# Patient Record
Sex: Male | Born: 1960 | Race: White | Hispanic: No | Marital: Married | Smoking: Never smoker
Health system: Southern US, Community
[De-identification: ages and names within clinical notes are randomized; demographics above are authoritative.]

## PROBLEM LIST (undated history)

## (undated) DIAGNOSIS — K625 Hemorrhage of anus and rectum: Secondary | ICD-10-CM

## (undated) HISTORY — PX: SIGMOIDOSCOPY: SHX6686

---

## 1999-09-20 ENCOUNTER — Encounter: Payer: Self-pay | Admitting: Emergency Medicine

## 1999-09-20 ENCOUNTER — Emergency Department (HOSPITAL_COMMUNITY): Admission: EM | Admit: 1999-09-20 | Discharge: 1999-09-20 | Payer: Self-pay | Admitting: Emergency Medicine

## 2001-01-13 ENCOUNTER — Encounter: Payer: Self-pay | Admitting: Family Medicine

## 2001-01-13 ENCOUNTER — Ambulatory Visit (HOSPITAL_COMMUNITY): Admission: EM | Admit: 2001-01-13 | Discharge: 2001-01-13 | Payer: Self-pay | Admitting: Family Medicine

## 2003-12-20 ENCOUNTER — Ambulatory Visit (HOSPITAL_COMMUNITY): Admission: RE | Admit: 2003-12-20 | Discharge: 2003-12-20 | Payer: Self-pay | Admitting: Family Medicine

## 2004-03-27 ENCOUNTER — Ambulatory Visit (HOSPITAL_COMMUNITY): Admission: RE | Admit: 2004-03-27 | Discharge: 2004-03-27 | Payer: Self-pay | Admitting: Family Medicine

## 2004-12-01 ENCOUNTER — Emergency Department (HOSPITAL_COMMUNITY): Admission: EM | Admit: 2004-12-01 | Discharge: 2004-12-01 | Payer: Self-pay | Admitting: Emergency Medicine

## 2010-03-06 ENCOUNTER — Ambulatory Visit (HOSPITAL_COMMUNITY): Admission: RE | Admit: 2010-03-06 | Discharge: 2010-03-06 | Payer: Self-pay | Admitting: Family Medicine

## 2010-03-08 ENCOUNTER — Other Ambulatory Visit: Payer: Self-pay | Admitting: Emergency Medicine

## 2010-03-08 ENCOUNTER — Inpatient Hospital Stay (HOSPITAL_COMMUNITY): Admission: EM | Admit: 2010-03-08 | Discharge: 2010-03-11 | Payer: Self-pay | Admitting: Internal Medicine

## 2011-02-20 LAB — COMPREHENSIVE METABOLIC PANEL
Alkaline Phosphatase: 43 U/L (ref 39–117)
Alkaline Phosphatase: 55 U/L (ref 39–117)
BUN: 13 mg/dL (ref 6–23)
CO2: 25 mEq/L (ref 19–32)
Calcium: 8.4 mg/dL (ref 8.4–10.5)
Creatinine, Ser: 1.11 mg/dL (ref 0.4–1.5)
Creatinine, Ser: 1.16 mg/dL (ref 0.4–1.5)
GFR calc Af Amer: >60 mL/min (ref >60)
Glucose, Bld: 105 mg/dL — ABNORMAL HIGH (ref 70–99)
Glucose, Bld: 86 mg/dL (ref 70–99)
Potassium: 3.8 mEq/L (ref 3.5–5.1)
Sodium: 139 mEq/L (ref 135–145)
Sodium: 140 mEq/L (ref 135–145)
Total Bilirubin: 0.5 mg/dL (ref 0.3–1.2)
Total Protein: 5.3 g/dL — ABNORMAL LOW (ref 6.0–8.3)
Total Protein: 7 g/dL (ref 6.0–8.3)

## 2011-02-20 LAB — GLUCOSE, CAPILLARY
Glucose-Capillary: 107 mg/dL — ABNORMAL HIGH (ref 70–99)
Glucose-Capillary: 142 mg/dL — ABNORMAL HIGH (ref 70–99)
Glucose-Capillary: 68 mg/dL — ABNORMAL LOW (ref 70–99)
Glucose-Capillary: 79 mg/dL (ref 70–99)
Glucose-Capillary: 79 mg/dL (ref 70–99)
Glucose-Capillary: 82 mg/dL (ref 70–99)
Glucose-Capillary: 92 mg/dL (ref 70–99)
Glucose-Capillary: 95 mg/dL (ref 70–99)

## 2011-02-20 LAB — DIFFERENTIAL
Basophils Absolute: 0 10*3/uL (ref 0.0–0.1)
Eosinophils Absolute: 0 10*3/uL (ref 0.0–0.7)
Lymphs Abs: 2 10*3/uL (ref 0.7–4.0)
Monocytes Absolute: 0.9 10*3/uL (ref 0.1–1.0)
Neutro Abs: 15.5 10*3/uL — ABNORMAL HIGH (ref 1.7–7.7)

## 2011-02-20 LAB — URINE CULTURE
Colony Count: NO GROWTH
Special Requests: NEGATIVE

## 2011-02-20 LAB — MAGNESIUM: Magnesium: 2 mg/dL (ref 1.5–2.5)

## 2011-02-20 LAB — CBC
HCT: 42.3 % (ref 39.0–52.0)
Hemoglobin: 11.6 g/dL — ABNORMAL LOW (ref 13.0–17.0)
Hemoglobin: 13.8 g/dL (ref 13.0–17.0)
MCHC: 34 g/dL (ref 30.0–36.0)
MCHC: 34.1 g/dL (ref 30.0–36.0)
MCV: 87.6 fL (ref 78.0–100.0)
Platelets: 293 10*3/uL (ref 150–400)
WBC: 18.4 10*3/uL — ABNORMAL HIGH (ref 4.0–10.5)

## 2011-02-20 LAB — BASIC METABOLIC PANEL
Calcium: 9.4 mg/dL (ref 8.4–10.5)
GFR calc Af Amer: >60 mL/min (ref >60)
GFR calc non Af Amer: 59 mL/min — ABNORMAL LOW (ref >60)

## 2011-02-20 LAB — URINALYSIS, ROUTINE W REFLEX MICROSCOPIC
Nitrite: NEGATIVE
Urobilinogen, UA: 0.2 mg/dL (ref 0.0–1.0)

## 2011-02-20 LAB — CULTURE, BLOOD (ROUTINE X 2)
Culture: NO GROWTH
Culture: NO GROWTH

## 2011-02-20 LAB — C-REACTIVE PROTEIN: CRP: 3.4 mg/dL — ABNORMAL HIGH (ref <0.6)

## 2011-02-20 LAB — URINE MICROSCOPIC-ADD ON

## 2011-02-20 LAB — STOOL CULTURE

## 2011-02-20 LAB — GIARDIA/CRYPTOSPORIDIUM SCREEN(EIA)
Cryptosporidium Screen (EIA): NEGATIVE
Giardia Screen - EIA: NEGATIVE

## 2011-02-20 LAB — HEMOCCULT GUIAC POC 1CARD (OFFICE): Fecal Occult Bld: NEGATIVE

## 2012-02-17 ENCOUNTER — Other Ambulatory Visit: Payer: Self-pay | Admitting: Family Medicine

## 2012-02-17 DIAGNOSIS — N508 Other specified disorders of male genital organs: Secondary | ICD-10-CM

## 2012-03-11 ENCOUNTER — Ambulatory Visit
Admission: RE | Admit: 2012-03-11 | Discharge: 2012-03-11 | Disposition: A | Discharge status: Home / Self Care | Payer: 59 | Source: Ambulatory Visit | Attending: Family Medicine | Admitting: Family Medicine

## 2012-03-11 ENCOUNTER — Other Ambulatory Visit: Payer: Self-pay | Admitting: Family Medicine

## 2012-03-11 DIAGNOSIS — N508 Other specified disorders of male genital organs: Secondary | ICD-10-CM

## 2013-08-18 ENCOUNTER — Emergency Department (HOSPITAL_BASED_OUTPATIENT_CLINIC_OR_DEPARTMENT_OTHER)
Admission: EM | Admit: 2013-08-18 | Discharge: 2013-08-18 | Disposition: A | Discharge status: Home / Self Care | Payer: BC Managed Care – PPO | Attending: Emergency Medicine | Admitting: Emergency Medicine

## 2013-08-18 ENCOUNTER — Encounter (HOSPITAL_BASED_OUTPATIENT_CLINIC_OR_DEPARTMENT_OTHER): Payer: Self-pay | Admitting: *Deleted

## 2013-08-18 DIAGNOSIS — K625 Hemorrhage of anus and rectum: Secondary | ICD-10-CM | POA: Insufficient documentation

## 2013-08-18 DIAGNOSIS — R5381 Other malaise: Secondary | ICD-10-CM | POA: Insufficient documentation

## 2013-08-18 DIAGNOSIS — R109 Unspecified abdominal pain: Secondary | ICD-10-CM | POA: Insufficient documentation

## 2013-08-18 LAB — COMPREHENSIVE METABOLIC PANEL
Alkaline Phosphatase: 58 U/L (ref 39–117)
BUN: 10 mg/dL (ref 6–23)
Chloride: 100 mEq/L (ref 96–112)
Creatinine, Ser: 1.1 mg/dL (ref 0.50–1.35)
GFR calc Af Amer: 88 mL/min — ABNORMAL LOW (ref >90)
Glucose, Bld: 104 mg/dL — ABNORMAL HIGH (ref 70–99)
Potassium: 4.1 mEq/L (ref 3.5–5.1)
Total Bilirubin: 0.5 mg/dL (ref 0.3–1.2)
Total Protein: 7.3 g/dL (ref 6.0–8.3)

## 2013-08-18 LAB — CBC WITH DIFFERENTIAL/PLATELET
Basophils Absolute: 0 10*3/uL (ref 0.0–0.1)
Basophils Relative: 0 % (ref 0–1)
Eosinophils Absolute: 0.2 10*3/uL (ref 0.0–0.7)
Eosinophils Relative: 2 % (ref 0–5)
HCT: 42.8 % (ref 39.0–52.0)
Hemoglobin: 14.6 g/dL (ref 13.0–17.0)
Lymphocytes Relative: 23 % (ref 12–46)
Lymphs Abs: 1.8 10*3/uL (ref 0.7–4.0)
MCH: 29.3 pg (ref 26.0–34.0)
MCHC: 34.1 g/dL (ref 30.0–36.0)
MCV: 85.8 fL (ref 78.0–100.0)
Monocytes Absolute: 0.8 10*3/uL (ref 0.1–1.0)
Monocytes Relative: 10 % (ref 3–12)
Neutro Abs: 5.2 10*3/uL (ref 1.7–7.7)
Neutrophils Relative %: 65 % (ref 43–77)
Platelets: 276 10*3/uL (ref 150–400)
RBC: 4.99 MIL/uL (ref 4.22–5.81)
RDW: 13.8 % (ref 11.5–15.5)
WBC: 7.9 10*3/uL (ref 4.0–10.5)

## 2013-08-18 LAB — OCCULT BLOOD X 1 CARD TO LAB, STOOL: Fecal Occult Bld: POSITIVE — AB

## 2013-08-18 MED ORDER — SODIUM CHLORIDE 0.9 % IV BOLUS (SEPSIS)
1000.0000 mL | Freq: Once | INTRAVENOUS | Status: AC
  Administered 2013-08-18: 1000 mL via INTRAVENOUS

## 2013-08-18 NOTE — ED Notes (Signed)
Pt reports that he was treated by PCP with antibiotics for colitis since last week-pt has hx of colitis.  States that he has had bright red rectal blood with abdominal cramping since Monday.  States that he has an appointment with GI on Monday.

## 2013-08-18 NOTE — ED Provider Notes (Signed)
CSN: [id number]     Arrival date & time 08/18/13  3086 History   First MD Initiated Contact with Patient 08/18/13 0957     Chief Complaint  Patient presents with  . Ulcerative Colitis  . Rectal Bleeding   (Consider location/radiation/quality/duration/timing/severity/associated sxs/prior Treatment) HPI Comments: Pt saw PCP yesterday and GI consult arranged but can't be seen till Monday.  However now having bloody stools every 2 hours.  Patient is a 52 y.o. male presenting with hematochezia. The history is provided by the patient.  Rectal Bleeding Quality:  Bright red (clots) Amount:  Copious Duration:  3 days Timing:  Constant Progression:  Worsening Chronicity:  Recurrent Context: defecation and diarrhea   Context: not constipation and not hemorrhoids   Similar prior episodes: yes   Relieved by:  Nothing Worsened by:  Nothing tried Ineffective treatments:  None tried Associated symptoms: abdominal pain   Associated symptoms: no fever, no light-headedness, no loss of consciousness and no vomiting   Associated symptoms comment:  Anorexia Risk factors comment:  Started with diarrhea at the end of aug and completed a full week course of cipro/flagyl and sx started back with finished.  blood started on monday   History reviewed. No pertinent past medical history. History reviewed. No pertinent past surgical history. History reviewed. No pertinent family history. History  Substance Use Topics  . Smoking status: Never Smoker   . Smokeless tobacco: Not on file  . Alcohol Use: No    Review of Systems  Constitutional: Positive for fatigue. Negative for fever.  Gastrointestinal: Positive for abdominal pain and hematochezia. Negative for vomiting.  Neurological: Negative for loss of consciousness and light-headedness.  All other systems reviewed and are negative.    Allergies  Review of patient's allergies indicates no known allergies.  Home Medications  No current outpatient  prescriptions on file. BP 124/76  Pulse 78  Temp(Src) 98.1 F (36.7 C) (Oral)  Resp 16  Ht 5\' 10"  (1.778 m)  Wt 165 lb (74.844 kg)  BMI 23.68 kg/m2  SpO2 100% Physical Exam  Nursing note and vitals reviewed. Constitutional: He is oriented to person, place, and time. He appears well-developed and well-nourished. No distress.  HENT:  Head: Normocephalic and atraumatic.  Mouth/Throat: Oropharynx is clear and moist.  Eyes: Conjunctivae and EOM are normal. Pupils are equal, round, and reactive to light.  Neck: Normal range of motion. Neck supple.  Cardiovascular: Normal rate, regular rhythm and intact distal pulses.   No murmur heard. Pulmonary/Chest: Effort normal and breath sounds normal. No respiratory distress. He has no wheezes. He has no rales.  Abdominal: Soft. He exhibits no distension. There is no tenderness. There is no rebound and no guarding.  Genitourinary: Guaiac positive stool.  No hemorrhoids or frank blood on exam  Musculoskeletal: Normal range of motion. He exhibits no edema and no tenderness.  Neurological: He is alert and oriented to person, place, and time.  Skin: Skin is warm and dry. No rash noted. No erythema.  Psychiatric: He has a normal mood and affect. His behavior is normal.    ED Course  Procedures (including critical care time) Labs Review Labs Reviewed  COMPREHENSIVE METABOLIC PANEL - Abnormal; Notable for the following:    Glucose, Bld 104 (*)    GFR calc non Af Amer 76 (*)    GFR calc Af Amer 88 (*)    All other components within normal limits  OCCULT BLOOD X 1 CARD TO LAB, STOOL - Abnormal; Notable for the  following:    Fecal Occult Bld POSITIVE (*)    All other components within normal limits  CBC WITH DIFFERENTIAL   Imaging Review No results found.  MDM   1. Rectal bleeding     Pt here with rectal bleeding since Monday with bright blood and clot that occures every 2 hours and not improving.  Was on cipro/flagyl last week for abd  cramping and diarrhea but blood just started.  Pt denies any food exposures or recent travel.  1 prior hx of same with hospitalization and IV abx with normal colonoscopy.  Pt denies any sx of anemia and on exam right lower quadrant tenderness and LUQ tenderness.  VS stable and hemoccult positive without external signs of bleeding.  Hb stable at 14.  Will discuss with GI.  NO other med hx and blood thinnners.  12:40 PM Labs all stable.  Pt is stable.  Spoke with GI and they will see him tomorrow at 11:15  Gwyneth Sprout, MD 08/18/13 1240

## 2016-12-16 ENCOUNTER — Ambulatory Visit
Admission: RE | Admit: 2016-12-16 | Discharge: 2016-12-16 | Disposition: A | Discharge status: Home / Self Care | Payer: Managed Care, Other (non HMO) | Source: Ambulatory Visit | Attending: Physician Assistant | Admitting: Physician Assistant

## 2016-12-16 ENCOUNTER — Other Ambulatory Visit: Payer: Self-pay | Admitting: Physician Assistant

## 2016-12-16 DIAGNOSIS — R0781 Pleurodynia: Secondary | ICD-10-CM

## 2018-12-07 DIAGNOSIS — J209 Acute bronchitis, unspecified: Secondary | ICD-10-CM | POA: Diagnosis not present

## 2019-03-01 DIAGNOSIS — Z209 Contact with and (suspected) exposure to unspecified communicable disease: Secondary | ICD-10-CM | POA: Diagnosis not present

## 2019-06-09 DIAGNOSIS — M25561 Pain in right knee: Secondary | ICD-10-CM | POA: Diagnosis not present

## 2019-07-07 DIAGNOSIS — M25561 Pain in right knee: Secondary | ICD-10-CM | POA: Diagnosis not present

## 2019-07-26 DIAGNOSIS — J0100 Acute maxillary sinusitis, unspecified: Secondary | ICD-10-CM | POA: Diagnosis not present

## 2019-07-26 DIAGNOSIS — Z20828 Contact with and (suspected) exposure to other viral communicable diseases: Secondary | ICD-10-CM | POA: Diagnosis not present

## 2019-09-01 DIAGNOSIS — D225 Melanocytic nevi of trunk: Secondary | ICD-10-CM | POA: Diagnosis not present

## 2019-09-01 DIAGNOSIS — D1801 Hemangioma of skin and subcutaneous tissue: Secondary | ICD-10-CM | POA: Diagnosis not present

## 2019-09-01 DIAGNOSIS — L570 Actinic keratosis: Secondary | ICD-10-CM | POA: Diagnosis not present

## 2019-09-01 DIAGNOSIS — L821 Other seborrheic keratosis: Secondary | ICD-10-CM | POA: Diagnosis not present

## 2019-09-17 DIAGNOSIS — Z125 Encounter for screening for malignant neoplasm of prostate: Secondary | ICD-10-CM | POA: Diagnosis not present

## 2019-09-17 DIAGNOSIS — E7800 Pure hypercholesterolemia, unspecified: Secondary | ICD-10-CM | POA: Diagnosis not present

## 2019-09-17 DIAGNOSIS — Z1211 Encounter for screening for malignant neoplasm of colon: Secondary | ICD-10-CM | POA: Diagnosis not present

## 2019-09-17 DIAGNOSIS — Z0000 Encounter for general adult medical examination without abnormal findings: Secondary | ICD-10-CM | POA: Diagnosis not present

## 2019-10-08 DIAGNOSIS — M25561 Pain in right knee: Secondary | ICD-10-CM | POA: Diagnosis not present

## 2019-10-12 DIAGNOSIS — M25561 Pain in right knee: Secondary | ICD-10-CM | POA: Diagnosis not present

## 2019-10-15 DIAGNOSIS — M25561 Pain in right knee: Secondary | ICD-10-CM | POA: Diagnosis not present

## 2019-10-19 DIAGNOSIS — Z23 Encounter for immunization: Secondary | ICD-10-CM | POA: Diagnosis not present

## 2019-10-19 DIAGNOSIS — E7800 Pure hypercholesterolemia, unspecified: Secondary | ICD-10-CM | POA: Diagnosis not present

## 2019-10-19 DIAGNOSIS — Z125 Encounter for screening for malignant neoplasm of prostate: Secondary | ICD-10-CM | POA: Diagnosis not present

## 2019-11-02 HISTORY — PX: OTHER SURGICAL HISTORY: SHX169

## 2019-11-04 DIAGNOSIS — X58XXXA Exposure to other specified factors, initial encounter: Secondary | ICD-10-CM | POA: Diagnosis not present

## 2019-11-04 DIAGNOSIS — M94261 Chondromalacia, right knee: Secondary | ICD-10-CM | POA: Diagnosis not present

## 2019-11-04 DIAGNOSIS — S83271A Complex tear of lateral meniscus, current injury, right knee, initial encounter: Secondary | ICD-10-CM | POA: Diagnosis not present

## 2019-11-04 DIAGNOSIS — Y999 Unspecified external cause status: Secondary | ICD-10-CM | POA: Diagnosis not present

## 2019-11-04 DIAGNOSIS — S83231A Complex tear of medial meniscus, current injury, right knee, initial encounter: Secondary | ICD-10-CM | POA: Diagnosis not present

## 2019-11-04 DIAGNOSIS — S83241A Other tear of medial meniscus, current injury, right knee, initial encounter: Secondary | ICD-10-CM | POA: Diagnosis not present

## 2019-11-04 DIAGNOSIS — S83281A Other tear of lateral meniscus, current injury, right knee, initial encounter: Secondary | ICD-10-CM | POA: Diagnosis not present

## 2019-11-15 DIAGNOSIS — S83241D Other tear of medial meniscus, current injury, right knee, subsequent encounter: Secondary | ICD-10-CM | POA: Diagnosis not present

## 2019-11-15 DIAGNOSIS — S83281D Other tear of lateral meniscus, current injury, right knee, subsequent encounter: Secondary | ICD-10-CM | POA: Diagnosis not present

## 2019-12-01 DIAGNOSIS — S83241D Other tear of medial meniscus, current injury, right knee, subsequent encounter: Secondary | ICD-10-CM | POA: Diagnosis not present

## 2019-12-01 DIAGNOSIS — S83281D Other tear of lateral meniscus, current injury, right knee, subsequent encounter: Secondary | ICD-10-CM | POA: Diagnosis not present

## 2020-01-07 DIAGNOSIS — Z03818 Encounter for observation for suspected exposure to other biological agents ruled out: Secondary | ICD-10-CM | POA: Diagnosis not present

## 2020-01-07 DIAGNOSIS — Z20828 Contact with and (suspected) exposure to other viral communicable diseases: Secondary | ICD-10-CM | POA: Diagnosis not present

## 2020-01-10 DIAGNOSIS — Z20828 Contact with and (suspected) exposure to other viral communicable diseases: Secondary | ICD-10-CM | POA: Diagnosis not present

## 2020-01-25 DIAGNOSIS — Z03818 Encounter for observation for suspected exposure to other biological agents ruled out: Secondary | ICD-10-CM | POA: Diagnosis not present

## 2020-01-25 DIAGNOSIS — Z20828 Contact with and (suspected) exposure to other viral communicable diseases: Secondary | ICD-10-CM | POA: Diagnosis not present

## 2020-01-28 DIAGNOSIS — Z1152 Encounter for screening for COVID-19: Secondary | ICD-10-CM | POA: Diagnosis not present

## 2020-01-28 DIAGNOSIS — Z03818 Encounter for observation for suspected exposure to other biological agents ruled out: Secondary | ICD-10-CM | POA: Diagnosis not present

## 2020-08-31 DIAGNOSIS — D1801 Hemangioma of skin and subcutaneous tissue: Secondary | ICD-10-CM | POA: Diagnosis not present

## 2020-08-31 DIAGNOSIS — L814 Other melanin hyperpigmentation: Secondary | ICD-10-CM | POA: Diagnosis not present

## 2020-08-31 DIAGNOSIS — L819 Disorder of pigmentation, unspecified: Secondary | ICD-10-CM | POA: Diagnosis not present

## 2020-08-31 DIAGNOSIS — L570 Actinic keratosis: Secondary | ICD-10-CM | POA: Diagnosis not present

## 2020-08-31 DIAGNOSIS — L578 Other skin changes due to chronic exposure to nonionizing radiation: Secondary | ICD-10-CM | POA: Diagnosis not present

## 2020-10-16 DIAGNOSIS — Z20822 Contact with and (suspected) exposure to covid-19: Secondary | ICD-10-CM | POA: Diagnosis not present

## 2021-02-23 ENCOUNTER — Other Ambulatory Visit: Payer: Self-pay | Admitting: Physician Assistant

## 2021-02-23 DIAGNOSIS — Z01818 Encounter for other preprocedural examination: Secondary | ICD-10-CM | POA: Diagnosis not present

## 2021-02-23 DIAGNOSIS — R1310 Dysphagia, unspecified: Secondary | ICD-10-CM

## 2021-02-23 DIAGNOSIS — R109 Unspecified abdominal pain: Secondary | ICD-10-CM | POA: Diagnosis not present

## 2021-02-23 DIAGNOSIS — K625 Hemorrhage of anus and rectum: Secondary | ICD-10-CM | POA: Diagnosis not present

## 2021-02-27 ENCOUNTER — Ambulatory Visit
Admission: RE | Admit: 2021-02-27 | Discharge: 2021-02-27 | Disposition: A | Discharge status: Home / Self Care | Payer: Managed Care, Other (non HMO) | Source: Ambulatory Visit | Attending: Physician Assistant | Admitting: Physician Assistant

## 2021-02-27 DIAGNOSIS — R1310 Dysphagia, unspecified: Secondary | ICD-10-CM

## 2021-06-22 DIAGNOSIS — K625 Hemorrhage of anus and rectum: Secondary | ICD-10-CM | POA: Diagnosis not present

## 2021-06-22 DIAGNOSIS — R933 Abnormal findings on diagnostic imaging of other parts of digestive tract: Secondary | ICD-10-CM | POA: Diagnosis not present

## 2021-06-22 DIAGNOSIS — K222 Esophageal obstruction: Secondary | ICD-10-CM | POA: Diagnosis not present

## 2021-06-22 DIAGNOSIS — K317 Polyp of stomach and duodenum: Secondary | ICD-10-CM | POA: Diagnosis not present

## 2021-06-22 DIAGNOSIS — R1314 Dysphagia, pharyngoesophageal phase: Secondary | ICD-10-CM | POA: Diagnosis not present

## 2021-07-02 HISTORY — PX: UPPER GI ENDOSCOPY: SHX6162

## 2021-07-02 HISTORY — PX: OTHER SURGICAL HISTORY: SHX169

## 2021-07-20 ENCOUNTER — Ambulatory Visit
Admission: RE | Admit: 2021-07-20 | Discharge: 2021-07-20 | Disposition: A | Discharge status: Home / Self Care | Payer: BC Managed Care – PPO | Source: Ambulatory Visit | Attending: Physician Assistant | Admitting: Physician Assistant

## 2021-07-20 ENCOUNTER — Other Ambulatory Visit: Payer: Self-pay

## 2021-07-20 ENCOUNTER — Other Ambulatory Visit: Payer: Self-pay | Admitting: Physician Assistant

## 2021-07-20 DIAGNOSIS — K648 Other hemorrhoids: Secondary | ICD-10-CM | POA: Diagnosis not present

## 2021-07-20 DIAGNOSIS — R079 Chest pain, unspecified: Secondary | ICD-10-CM | POA: Diagnosis not present

## 2021-07-20 DIAGNOSIS — K2100 Gastro-esophageal reflux disease with esophagitis, without bleeding: Secondary | ICD-10-CM | POA: Diagnosis not present

## 2021-07-20 DIAGNOSIS — K222 Esophageal obstruction: Secondary | ICD-10-CM | POA: Diagnosis not present

## 2021-08-03 DIAGNOSIS — Z720 Tobacco use: Secondary | ICD-10-CM | POA: Diagnosis not present

## 2021-08-31 DIAGNOSIS — R252 Cramp and spasm: Secondary | ICD-10-CM | POA: Diagnosis not present

## 2021-08-31 DIAGNOSIS — Z125 Encounter for screening for malignant neoplasm of prostate: Secondary | ICD-10-CM | POA: Diagnosis not present

## 2021-08-31 DIAGNOSIS — Z0000 Encounter for general adult medical examination without abnormal findings: Secondary | ICD-10-CM | POA: Diagnosis not present

## 2021-08-31 DIAGNOSIS — Z23 Encounter for immunization: Secondary | ICD-10-CM | POA: Diagnosis not present

## 2021-08-31 DIAGNOSIS — K2100 Gastro-esophageal reflux disease with esophagitis, without bleeding: Secondary | ICD-10-CM | POA: Diagnosis not present

## 2021-08-31 DIAGNOSIS — E7800 Pure hypercholesterolemia, unspecified: Secondary | ICD-10-CM | POA: Diagnosis not present

## 2021-10-01 DIAGNOSIS — K641 Second degree hemorrhoids: Secondary | ICD-10-CM | POA: Diagnosis not present

## 2021-10-22 DIAGNOSIS — K641 Second degree hemorrhoids: Secondary | ICD-10-CM | POA: Diagnosis not present

## 2021-10-29 DIAGNOSIS — K645 Perianal venous thrombosis: Secondary | ICD-10-CM | POA: Diagnosis not present

## 2021-10-29 DIAGNOSIS — K641 Second degree hemorrhoids: Secondary | ICD-10-CM | POA: Diagnosis not present

## 2021-11-19 DIAGNOSIS — K641 Second degree hemorrhoids: Secondary | ICD-10-CM | POA: Diagnosis not present

## 2021-11-22 DIAGNOSIS — L570 Actinic keratosis: Secondary | ICD-10-CM | POA: Diagnosis not present

## 2021-11-22 DIAGNOSIS — D225 Melanocytic nevi of trunk: Secondary | ICD-10-CM | POA: Diagnosis not present

## 2021-11-22 DIAGNOSIS — D485 Neoplasm of uncertain behavior of skin: Secondary | ICD-10-CM | POA: Diagnosis not present

## 2021-11-22 DIAGNOSIS — L814 Other melanin hyperpigmentation: Secondary | ICD-10-CM | POA: Diagnosis not present

## 2021-11-22 DIAGNOSIS — L821 Other seborrheic keratosis: Secondary | ICD-10-CM | POA: Diagnosis not present

## 2021-11-22 DIAGNOSIS — D2371 Other benign neoplasm of skin of right lower limb, including hip: Secondary | ICD-10-CM | POA: Diagnosis not present

## 2022-01-21 DIAGNOSIS — U071 COVID-19: Secondary | ICD-10-CM | POA: Diagnosis not present

## 2022-01-21 DIAGNOSIS — W19XXXA Unspecified fall, initial encounter: Secondary | ICD-10-CM | POA: Diagnosis not present

## 2022-01-21 DIAGNOSIS — R4020 Unspecified coma: Secondary | ICD-10-CM | POA: Diagnosis not present

## 2022-02-12 DIAGNOSIS — K648 Other hemorrhoids: Secondary | ICD-10-CM | POA: Diagnosis not present

## 2022-02-12 DIAGNOSIS — K625 Hemorrhage of anus and rectum: Secondary | ICD-10-CM | POA: Diagnosis not present

## 2022-02-14 DIAGNOSIS — L570 Actinic keratosis: Secondary | ICD-10-CM | POA: Diagnosis not present

## 2022-02-14 DIAGNOSIS — Z09 Encounter for follow-up examination after completed treatment for conditions other than malignant neoplasm: Secondary | ICD-10-CM | POA: Diagnosis not present

## 2022-03-25 DIAGNOSIS — K649 Unspecified hemorrhoids: Secondary | ICD-10-CM | POA: Diagnosis not present

## 2022-03-25 DIAGNOSIS — K625 Hemorrhage of anus and rectum: Secondary | ICD-10-CM | POA: Diagnosis not present

## 2022-03-30 IMAGING — DX DG CHEST 2V
2 series · 2 of 2 positions shown · non-contrast
Comparison: None.

CLINICAL DATA: Left-sided chest pain.  Recent endoscopy.

EXAM:
CHEST - 2 VIEW

[dg chest 2 view (1 of 2)]
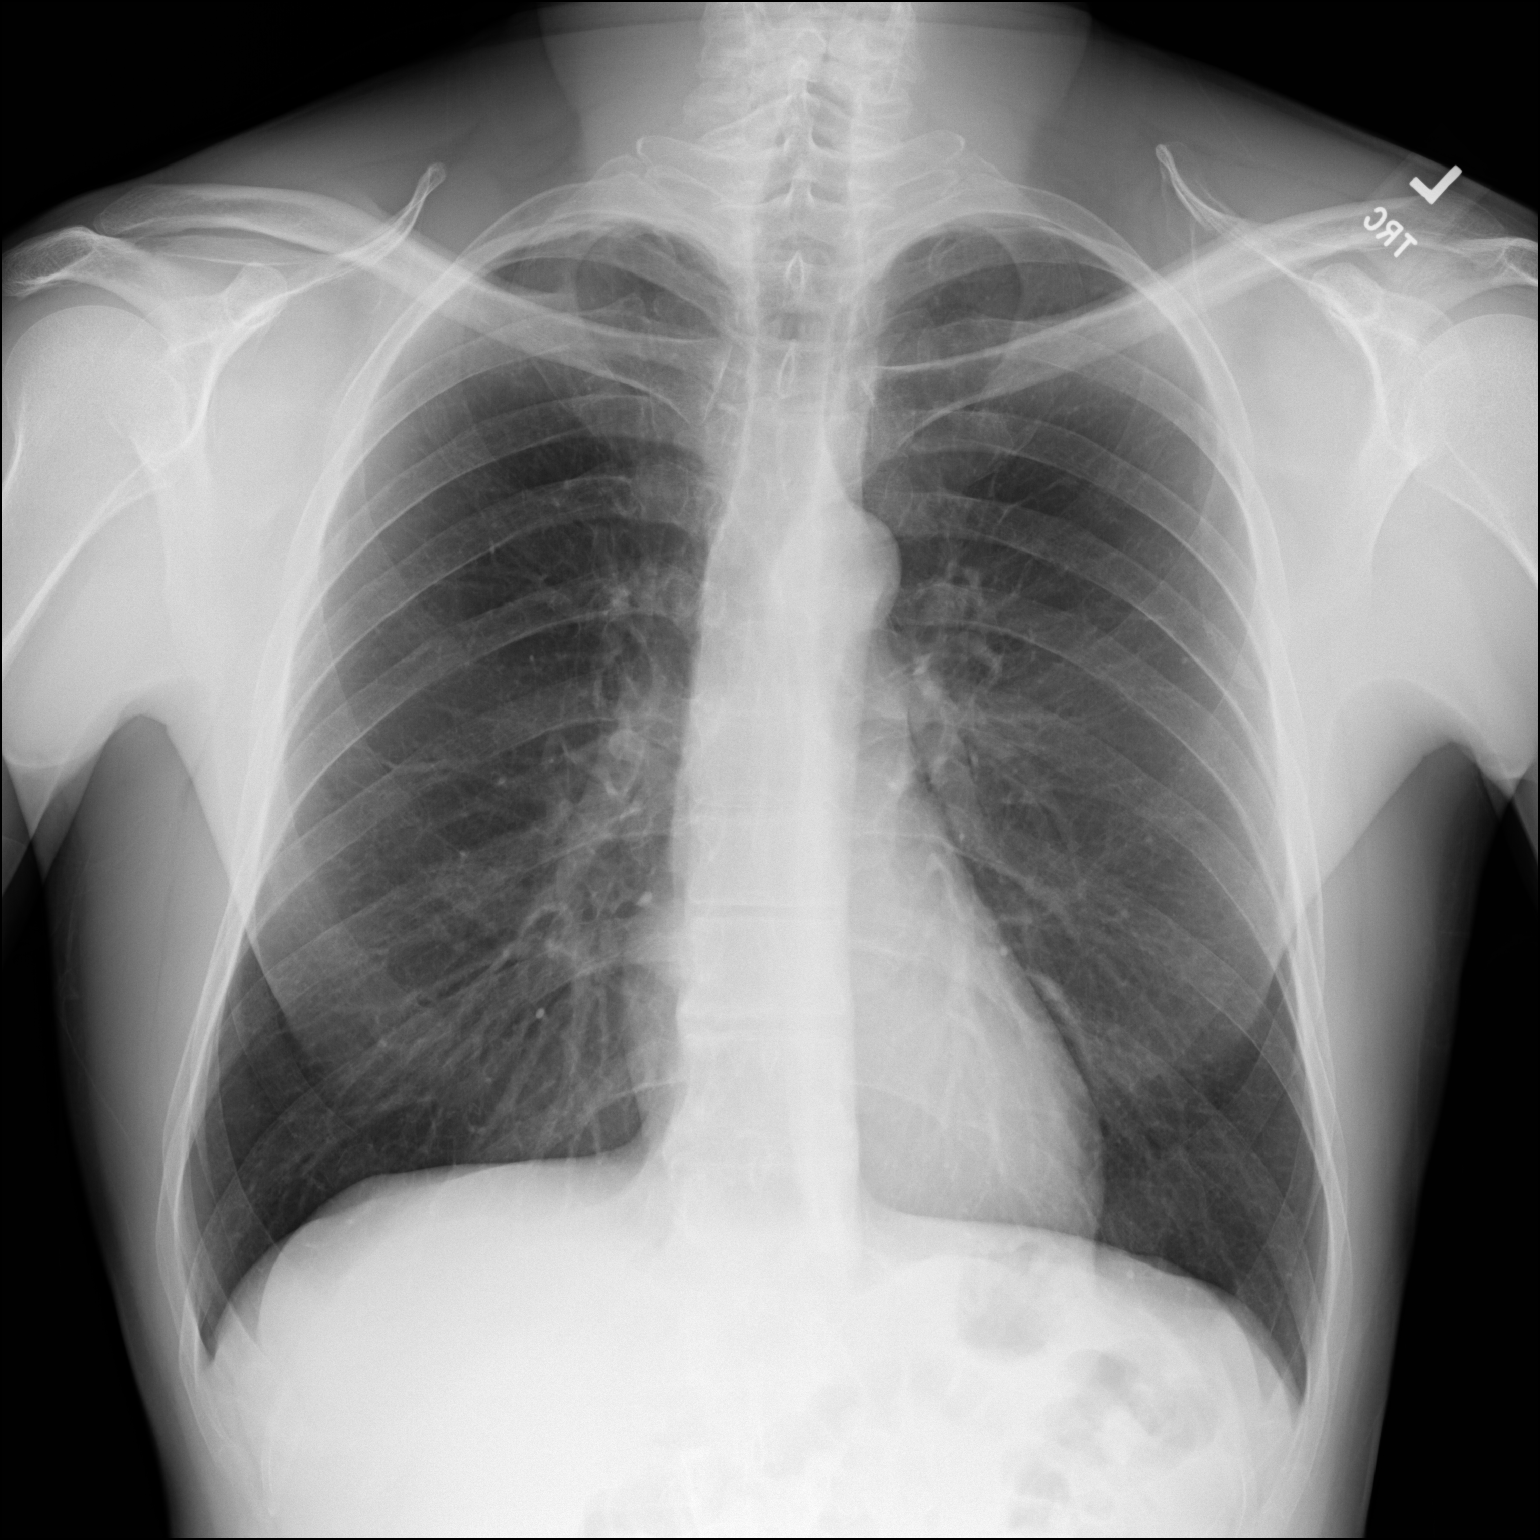

[dg chest 2 view (2 of 2)]
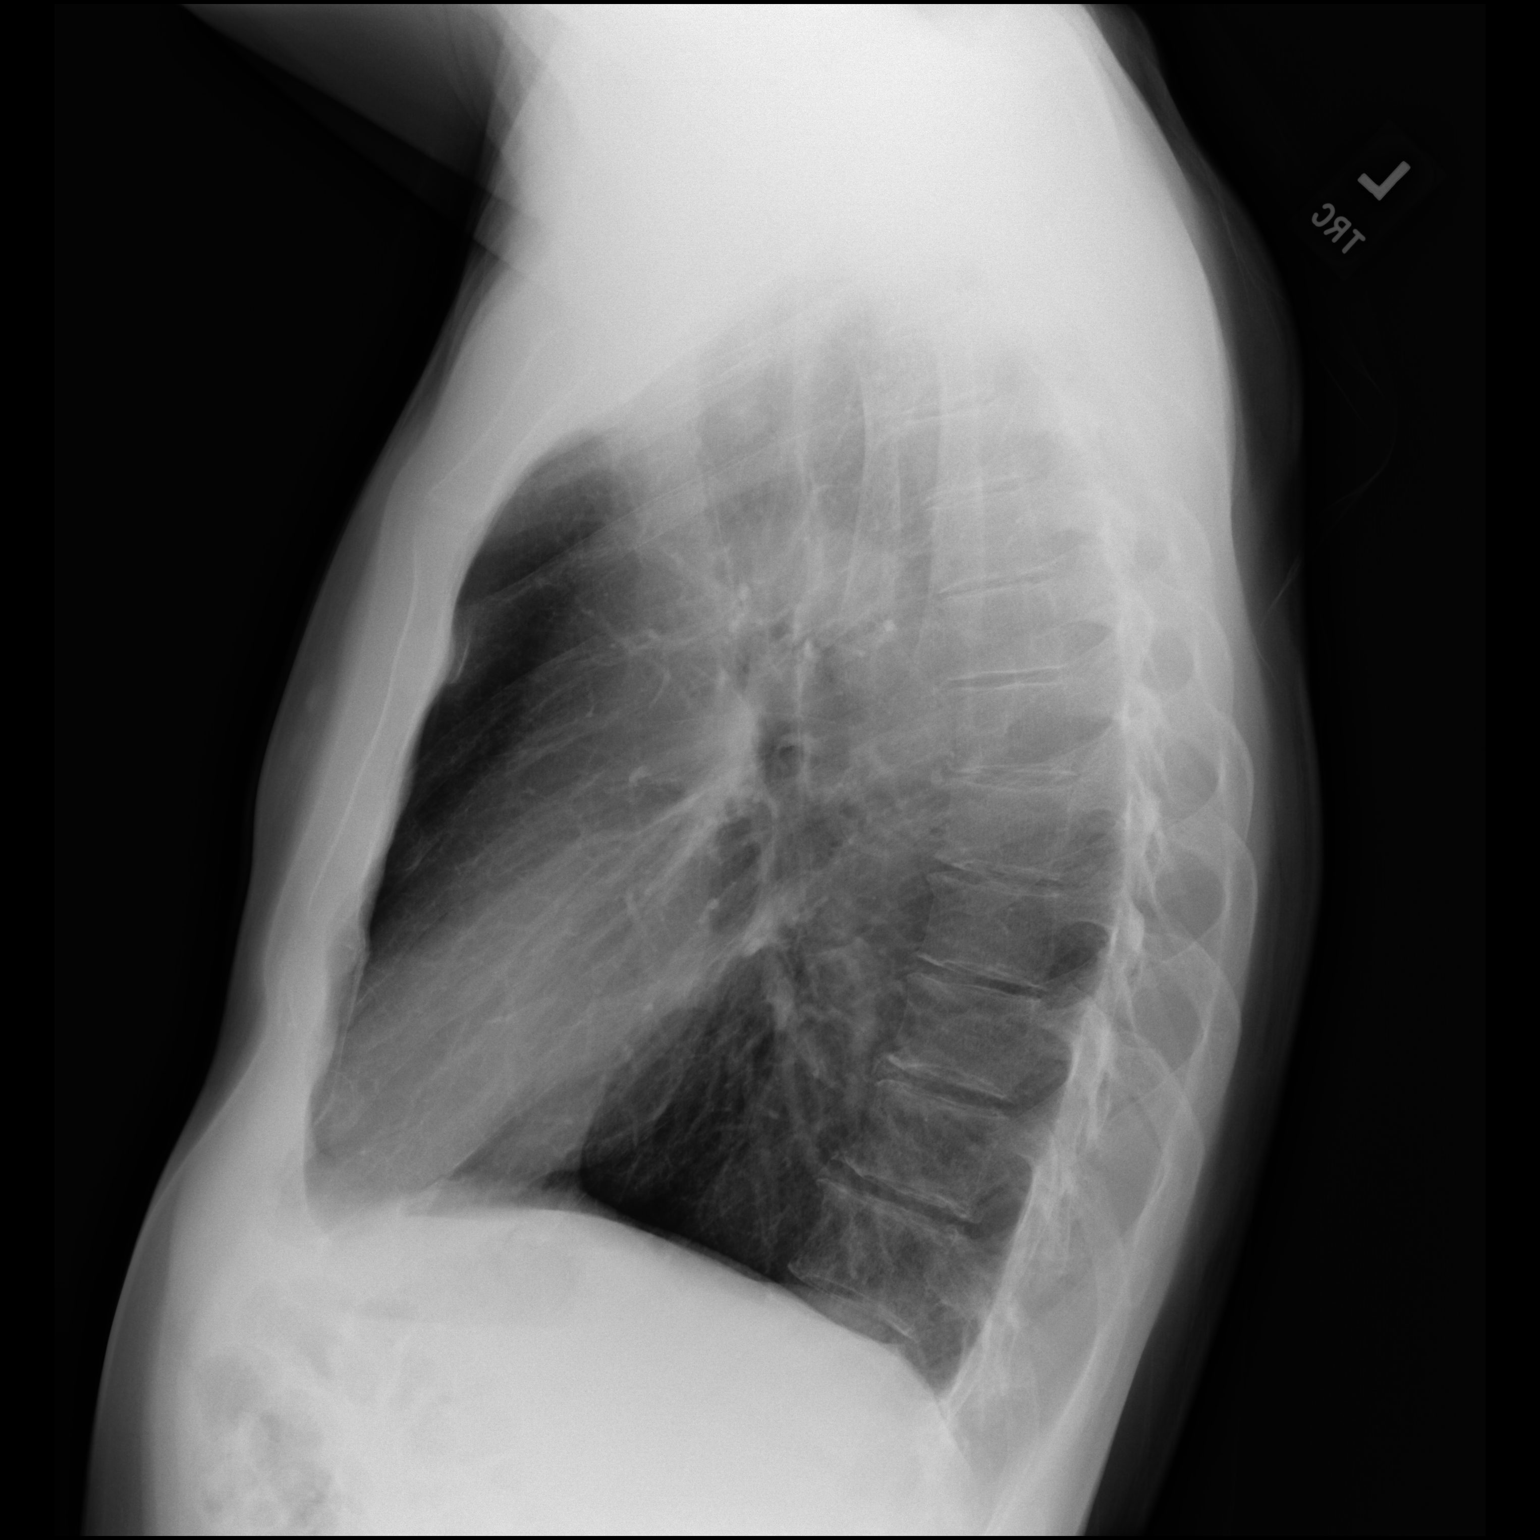

[2 of 2 positions shown; findings below may reference images not displayed]

FINDINGS: The heart size and mediastinal contours are within normal limits. No
evidence of pneumothorax or pneumomediastinum. Both lungs are clear.
The visualized skeletal structures are unremarkable.
IMPRESSION: No active cardiopulmonary disease.

## 2022-04-09 ENCOUNTER — Ambulatory Visit: Payer: Self-pay | Admitting: General Surgery

## 2022-04-09 DIAGNOSIS — K641 Second degree hemorrhoids: Secondary | ICD-10-CM | POA: Diagnosis not present

## 2022-04-09 NOTE — H&P (Signed)
?  PROVIDER:  Monico Blitz, MD ? ?MRN: [id number] ?DOB: 24-Jan-1961 ?DATE OF ENCOUNTER: 04/09/2022 ?Subjective  ? ?Chief Complaint: Follow-up ?  ? ? ?History of Present Illness: ? ?Bryan Clarke is a 61 y.o. male who is seen today for recurrent rectal bleeding. ?He presented to the office with complaints of intermittent rectal bleeding.  He stated this has been going on for years.  He recently underwent a colonoscopy which showed an internal hemorrhoid and was thought to be the source of his bleeding.  He reports regular bowel movements on a daily basis.  He denies any constipation or frequent diarrhea.  ? Rubber band ligation of the right anterior and right posterior hemorrhoids was performed on 10/22/2021.  The patient then presented to the office the following week with worsening pain and was noted to have a thrombosed hemorrhoid on the right side.  This was actively draining and he was placed on steroid cream for medical management.  Follow-up 1 month later showed no recurrent bleeding.  He states that he started bleeding again in January and has been chronic ever since. ?  ?  ? ? ? ?Objective:  ? ? ? ?General appearance - alert, well appearing, and in no distress ?CV: RRR ? ?Rectal - normal rectal, no masses ? ? ?Labs, Imaging and Diagnostic Testing: ? ? ?Procedure: Anoscopy ?Surgeon: Marcello Moores ?After the risks and benefits were explained, written consent was obtained for above procedure.  A medical assistant chaperone was present thoroughout the entire procedure.  ?Anesthesia: none ?Diagnosis: rectal bleeding ?Findings: Grade 2 right posterior hemorrhoid, grade 1 left lateral and right anterior hemorrhoid, no active bleeding ? ?Assessment and Plan:  ?Diagnoses and all orders for this visit: ? ?Grade II hemorrhoids ? ?61 year old male with recurrent rectal bleeding after rubber band ligation.  I have recommended proceeding with trans hemorrhoidal dearterialization procedure.  This is a 97% success rate for  hemorrhoidal bleeding.  We discussed the typical postoperative pain and recovery.  We discussed risk of bleeding, pain, urinary retention and need for additional surgeries.  We discussed the importance of avoiding constipation after surgery.  All questions were answered.  Patient would like to proceed with surgery, possibly sometime in July. ? ?No follow-ups on file. ? ? ? ?Rosario Adie, MD ?Colon and Rectal Surgery ?[facility]  ? ?

## 2022-06-18 ENCOUNTER — Other Ambulatory Visit: Payer: Self-pay

## 2022-06-18 ENCOUNTER — Encounter (HOSPITAL_BASED_OUTPATIENT_CLINIC_OR_DEPARTMENT_OTHER): Payer: Self-pay | Admitting: General Surgery

## 2022-06-18 NOTE — Progress Notes (Signed)
Spoke w/ via phone for pre-op interview---pt Lab needs dos----  none             Lab results------none COVID test -----patient states asymptomatic no test needed Arrive at -------600 am 06-28-2022 NPO after MN NO Solid Food.  Clear liquids from MN until---500 am Med rec completed Medications to take morning of surgery -----lansoprazole Diabetic medication -----n/a Patient instructed no nail polish to be worn day of surgery Patient instructed to bring photo id and insurance card day of surgery Patient aware to have Driver (ride ) / caregiver   wife cathy  for 24 hours after surgery  Patient Special Instructions -----none Pre-Op special Istructions -----none Patient verbalized understanding of instructions that were given at this phone interview. Patient denies shortness of breath, chest pain, fever, cough at this phone interview.

## 2022-06-28 DIAGNOSIS — Z01818 Encounter for other preprocedural examination: Secondary | ICD-10-CM

## 2022-06-28 HISTORY — DX: Hemorrhage of anus and rectum: K62.5

## 2022-07-18 DIAGNOSIS — K649 Unspecified hemorrhoids: Secondary | ICD-10-CM | POA: Diagnosis not present

## 2022-07-18 DIAGNOSIS — R5383 Other fatigue: Secondary | ICD-10-CM | POA: Diagnosis not present

## 2022-07-18 DIAGNOSIS — F320 Major depressive disorder, single episode, mild: Secondary | ICD-10-CM | POA: Diagnosis not present

## 2022-07-31 DIAGNOSIS — M25562 Pain in left knee: Secondary | ICD-10-CM | POA: Diagnosis not present

## 2022-07-31 DIAGNOSIS — M25462 Effusion, left knee: Secondary | ICD-10-CM | POA: Diagnosis not present

## 2022-08-15 ENCOUNTER — Ambulatory Visit: Payer: Self-pay | Admitting: General Surgery

## 2022-08-16 ENCOUNTER — Ambulatory Visit (HOSPITAL_BASED_OUTPATIENT_CLINIC_OR_DEPARTMENT_OTHER): Admission: RE | Admit: 2022-08-16 | Payer: BC Managed Care – PPO | Source: Home / Self Care | Admitting: General Surgery

## 2022-08-16 DIAGNOSIS — Z01818 Encounter for other preprocedural examination: Secondary | ICD-10-CM

## 2022-08-16 SURGERY — TRANSANAL HEMORRHOIDAL DEARTERIALIZATION
Anesthesia: Monitor Anesthesia Care

## 2022-09-06 DIAGNOSIS — Z23 Encounter for immunization: Secondary | ICD-10-CM | POA: Diagnosis not present

## 2022-09-06 DIAGNOSIS — E7800 Pure hypercholesterolemia, unspecified: Secondary | ICD-10-CM | POA: Diagnosis not present

## 2022-09-06 DIAGNOSIS — Z0000 Encounter for general adult medical examination without abnormal findings: Secondary | ICD-10-CM | POA: Diagnosis not present

## 2022-09-06 DIAGNOSIS — Z125 Encounter for screening for malignant neoplasm of prostate: Secondary | ICD-10-CM | POA: Diagnosis not present

## 2022-11-06 DIAGNOSIS — K649 Unspecified hemorrhoids: Secondary | ICD-10-CM | POA: Diagnosis not present

## 2022-11-29 DIAGNOSIS — Z03818 Encounter for observation for suspected exposure to other biological agents ruled out: Secondary | ICD-10-CM | POA: Diagnosis not present

## 2022-11-29 DIAGNOSIS — J018 Other acute sinusitis: Secondary | ICD-10-CM | POA: Diagnosis not present

## 2022-11-29 DIAGNOSIS — B349 Viral infection, unspecified: Secondary | ICD-10-CM | POA: Diagnosis not present

## 2022-11-29 DIAGNOSIS — J101 Influenza due to other identified influenza virus with other respiratory manifestations: Secondary | ICD-10-CM | POA: Diagnosis not present

## 2022-12-05 DIAGNOSIS — R059 Cough, unspecified: Secondary | ICD-10-CM | POA: Diagnosis not present

## 2022-12-05 DIAGNOSIS — J069 Acute upper respiratory infection, unspecified: Secondary | ICD-10-CM | POA: Diagnosis not present

## 2023-04-18 DIAGNOSIS — L814 Other melanin hyperpigmentation: Secondary | ICD-10-CM | POA: Diagnosis not present

## 2023-04-18 DIAGNOSIS — D225 Melanocytic nevi of trunk: Secondary | ICD-10-CM | POA: Diagnosis not present

## 2023-04-18 DIAGNOSIS — L820 Inflamed seborrheic keratosis: Secondary | ICD-10-CM | POA: Diagnosis not present

## 2023-04-18 DIAGNOSIS — L821 Other seborrheic keratosis: Secondary | ICD-10-CM | POA: Diagnosis not present

## 2023-04-18 DIAGNOSIS — L570 Actinic keratosis: Secondary | ICD-10-CM | POA: Diagnosis not present

## 2023-04-18 DIAGNOSIS — L538 Other specified erythematous conditions: Secondary | ICD-10-CM | POA: Diagnosis not present

## 2023-04-18 DIAGNOSIS — L578 Other skin changes due to chronic exposure to nonionizing radiation: Secondary | ICD-10-CM | POA: Diagnosis not present

## 2023-05-02 DIAGNOSIS — M722 Plantar fascial fibromatosis: Secondary | ICD-10-CM | POA: Diagnosis not present

## 2023-05-02 DIAGNOSIS — M79672 Pain in left foot: Secondary | ICD-10-CM | POA: Diagnosis not present

## 2023-12-05 ENCOUNTER — Other Ambulatory Visit (HOSPITAL_BASED_OUTPATIENT_CLINIC_OR_DEPARTMENT_OTHER): Payer: Self-pay | Admitting: Family Medicine

## 2023-12-05 DIAGNOSIS — H5461 Unqualified visual loss, right eye, normal vision left eye: Secondary | ICD-10-CM | POA: Diagnosis not present

## 2023-12-05 DIAGNOSIS — Z0000 Encounter for general adult medical examination without abnormal findings: Secondary | ICD-10-CM | POA: Diagnosis not present

## 2023-12-05 DIAGNOSIS — K219 Gastro-esophageal reflux disease without esophagitis: Secondary | ICD-10-CM | POA: Diagnosis not present

## 2023-12-05 DIAGNOSIS — Z125 Encounter for screening for malignant neoplasm of prostate: Secondary | ICD-10-CM | POA: Diagnosis not present

## 2023-12-05 DIAGNOSIS — N503 Cyst of epididymis: Secondary | ICD-10-CM | POA: Diagnosis not present

## 2023-12-05 DIAGNOSIS — E7800 Pure hypercholesterolemia, unspecified: Secondary | ICD-10-CM | POA: Diagnosis not present

## 2023-12-05 DIAGNOSIS — Z136 Encounter for screening for cardiovascular disorders: Secondary | ICD-10-CM

## 2023-12-23 ENCOUNTER — Ambulatory Visit (HOSPITAL_BASED_OUTPATIENT_CLINIC_OR_DEPARTMENT_OTHER)
Admission: RE | Admit: 2023-12-23 | Discharge: 2023-12-23 | Disposition: A | Discharge status: Home / Self Care | Payer: Self-pay | Source: Ambulatory Visit | Attending: Family Medicine | Admitting: Family Medicine

## 2023-12-23 DIAGNOSIS — Z136 Encounter for screening for cardiovascular disorders: Secondary | ICD-10-CM | POA: Insufficient documentation

## 2023-12-24 DIAGNOSIS — H5461 Unqualified visual loss, right eye, normal vision left eye: Secondary | ICD-10-CM | POA: Diagnosis not present

## 2023-12-24 DIAGNOSIS — R0989 Other specified symptoms and signs involving the circulatory and respiratory systems: Secondary | ICD-10-CM | POA: Diagnosis not present

## 2023-12-24 DIAGNOSIS — I6523 Occlusion and stenosis of bilateral carotid arteries: Secondary | ICD-10-CM | POA: Diagnosis not present

## 2023-12-24 DIAGNOSIS — N503 Cyst of epididymis: Secondary | ICD-10-CM | POA: Diagnosis not present

## 2023-12-24 DIAGNOSIS — H547 Unspecified visual loss: Secondary | ICD-10-CM | POA: Diagnosis not present

## 2023-12-30 DIAGNOSIS — L218 Other seborrheic dermatitis: Secondary | ICD-10-CM | POA: Diagnosis not present

## 2023-12-30 DIAGNOSIS — L814 Other melanin hyperpigmentation: Secondary | ICD-10-CM | POA: Diagnosis not present

## 2023-12-30 DIAGNOSIS — Z09 Encounter for follow-up examination after completed treatment for conditions other than malignant neoplasm: Secondary | ICD-10-CM | POA: Diagnosis not present

## 2023-12-30 DIAGNOSIS — L578 Other skin changes due to chronic exposure to nonionizing radiation: Secondary | ICD-10-CM | POA: Diagnosis not present

## 2023-12-30 DIAGNOSIS — L570 Actinic keratosis: Secondary | ICD-10-CM | POA: Diagnosis not present

## 2024-01-21 DIAGNOSIS — N5089 Other specified disorders of the male genital organs: Secondary | ICD-10-CM | POA: Diagnosis not present

## 2024-01-22 DIAGNOSIS — D4011 Neoplasm of uncertain behavior of right testis: Secondary | ICD-10-CM | POA: Diagnosis not present

## 2024-01-22 DIAGNOSIS — N503 Cyst of epididymis: Secondary | ICD-10-CM | POA: Diagnosis not present

## 2024-01-22 DIAGNOSIS — N433 Hydrocele, unspecified: Secondary | ICD-10-CM | POA: Diagnosis not present

## 2024-01-22 DIAGNOSIS — N5089 Other specified disorders of the male genital organs: Secondary | ICD-10-CM | POA: Diagnosis not present

## 2024-01-29 DIAGNOSIS — C6292 Malignant neoplasm of left testis, unspecified whether descended or undescended: Secondary | ICD-10-CM | POA: Diagnosis not present

## 2024-01-29 DIAGNOSIS — N2889 Other specified disorders of kidney and ureter: Secondary | ICD-10-CM | POA: Diagnosis not present

## 2024-01-29 DIAGNOSIS — C6291 Malignant neoplasm of right testis, unspecified whether descended or undescended: Secondary | ICD-10-CM | POA: Diagnosis not present

## 2024-01-29 DIAGNOSIS — N5089 Other specified disorders of the male genital organs: Secondary | ICD-10-CM | POA: Diagnosis not present

## 2024-01-29 DIAGNOSIS — Z79899 Other long term (current) drug therapy: Secondary | ICD-10-CM | POA: Diagnosis not present

## 2024-01-29 DIAGNOSIS — K219 Gastro-esophageal reflux disease without esophagitis: Secondary | ICD-10-CM | POA: Diagnosis not present

## 2024-02-10 DIAGNOSIS — Z9079 Acquired absence of other genital organ(s): Secondary | ICD-10-CM | POA: Diagnosis not present

## 2024-02-10 DIAGNOSIS — J9859 Other diseases of mediastinum, not elsewhere classified: Secondary | ICD-10-CM | POA: Diagnosis not present

## 2024-02-10 DIAGNOSIS — R590 Localized enlarged lymph nodes: Secondary | ICD-10-CM | POA: Diagnosis not present

## 2024-02-10 DIAGNOSIS — C772 Secondary and unspecified malignant neoplasm of intra-abdominal lymph nodes: Secondary | ICD-10-CM | POA: Diagnosis not present

## 2024-02-10 DIAGNOSIS — M4316 Spondylolisthesis, lumbar region: Secondary | ICD-10-CM | POA: Diagnosis not present

## 2024-02-10 DIAGNOSIS — C6290 Malignant neoplasm of unspecified testis, unspecified whether descended or undescended: Secondary | ICD-10-CM | POA: Diagnosis not present

## 2024-02-10 DIAGNOSIS — R911 Solitary pulmonary nodule: Secondary | ICD-10-CM | POA: Diagnosis not present

## 2024-02-13 DIAGNOSIS — N5089 Other specified disorders of the male genital organs: Secondary | ICD-10-CM | POA: Diagnosis not present

## 2024-02-16 DIAGNOSIS — J0110 Acute frontal sinusitis, unspecified: Secondary | ICD-10-CM | POA: Diagnosis not present

## 2024-02-24 DIAGNOSIS — C6211 Malignant neoplasm of descended right testis: Secondary | ICD-10-CM | POA: Diagnosis not present

## 2024-03-02 DIAGNOSIS — R0602 Shortness of breath: Secondary | ICD-10-CM | POA: Diagnosis not present

## 2024-03-03 DIAGNOSIS — C6290 Malignant neoplasm of unspecified testis, unspecified whether descended or undescended: Secondary | ICD-10-CM | POA: Diagnosis not present

## 2024-03-03 DIAGNOSIS — C6211 Malignant neoplasm of descended right testis: Secondary | ICD-10-CM | POA: Diagnosis not present

## 2024-03-04 DIAGNOSIS — C6211 Malignant neoplasm of descended right testis: Secondary | ICD-10-CM | POA: Diagnosis not present

## 2024-03-08 DIAGNOSIS — Z5111 Encounter for antineoplastic chemotherapy: Secondary | ICD-10-CM | POA: Diagnosis not present

## 2024-03-08 DIAGNOSIS — C6211 Malignant neoplasm of descended right testis: Secondary | ICD-10-CM | POA: Diagnosis not present

## 2024-03-08 DIAGNOSIS — R609 Edema, unspecified: Secondary | ICD-10-CM | POA: Diagnosis not present

## 2024-03-09 DIAGNOSIS — C6211 Malignant neoplasm of descended right testis: Secondary | ICD-10-CM | POA: Diagnosis not present

## 2024-03-09 DIAGNOSIS — Z5111 Encounter for antineoplastic chemotherapy: Secondary | ICD-10-CM | POA: Diagnosis not present

## 2024-03-09 DIAGNOSIS — R609 Edema, unspecified: Secondary | ICD-10-CM | POA: Diagnosis not present

## 2024-03-10 DIAGNOSIS — C6211 Malignant neoplasm of descended right testis: Secondary | ICD-10-CM | POA: Diagnosis not present

## 2024-03-10 DIAGNOSIS — R609 Edema, unspecified: Secondary | ICD-10-CM | POA: Diagnosis not present

## 2024-03-10 DIAGNOSIS — Z5111 Encounter for antineoplastic chemotherapy: Secondary | ICD-10-CM | POA: Diagnosis not present

## 2024-03-11 DIAGNOSIS — R609 Edema, unspecified: Secondary | ICD-10-CM | POA: Diagnosis not present

## 2024-03-11 DIAGNOSIS — C6211 Malignant neoplasm of descended right testis: Secondary | ICD-10-CM | POA: Diagnosis not present

## 2024-03-11 DIAGNOSIS — Z5111 Encounter for antineoplastic chemotherapy: Secondary | ICD-10-CM | POA: Diagnosis not present

## 2024-03-12 DIAGNOSIS — Z5111 Encounter for antineoplastic chemotherapy: Secondary | ICD-10-CM | POA: Diagnosis not present

## 2024-03-12 DIAGNOSIS — R609 Edema, unspecified: Secondary | ICD-10-CM | POA: Diagnosis not present

## 2024-03-12 DIAGNOSIS — C6211 Malignant neoplasm of descended right testis: Secondary | ICD-10-CM | POA: Diagnosis not present

## 2024-03-16 DIAGNOSIS — C6211 Malignant neoplasm of descended right testis: Secondary | ICD-10-CM | POA: Diagnosis not present

## 2024-03-16 DIAGNOSIS — R609 Edema, unspecified: Secondary | ICD-10-CM | POA: Diagnosis not present

## 2024-03-16 DIAGNOSIS — I340 Nonrheumatic mitral (valve) insufficiency: Secondary | ICD-10-CM | POA: Diagnosis not present

## 2024-03-16 DIAGNOSIS — E8770 Fluid overload, unspecified: Secondary | ICD-10-CM | POA: Diagnosis not present

## 2024-03-16 DIAGNOSIS — Z5111 Encounter for antineoplastic chemotherapy: Secondary | ICD-10-CM | POA: Diagnosis not present

## 2024-03-16 DIAGNOSIS — I088 Other rheumatic multiple valve diseases: Secondary | ICD-10-CM | POA: Diagnosis not present

## 2024-03-19 DIAGNOSIS — C6291 Malignant neoplasm of right testis, unspecified whether descended or undescended: Secondary | ICD-10-CM | POA: Diagnosis not present

## 2024-03-23 DIAGNOSIS — R609 Edema, unspecified: Secondary | ICD-10-CM | POA: Diagnosis not present

## 2024-03-23 DIAGNOSIS — Z5111 Encounter for antineoplastic chemotherapy: Secondary | ICD-10-CM | POA: Diagnosis not present

## 2024-03-23 DIAGNOSIS — C6211 Malignant neoplasm of descended right testis: Secondary | ICD-10-CM | POA: Diagnosis not present

## 2024-03-25 DIAGNOSIS — C6211 Malignant neoplasm of descended right testis: Secondary | ICD-10-CM | POA: Diagnosis not present

## 2024-03-25 DIAGNOSIS — Z5111 Encounter for antineoplastic chemotherapy: Secondary | ICD-10-CM | POA: Diagnosis not present

## 2024-03-25 DIAGNOSIS — R609 Edema, unspecified: Secondary | ICD-10-CM | POA: Diagnosis not present

## 2024-03-26 DIAGNOSIS — C6211 Malignant neoplasm of descended right testis: Secondary | ICD-10-CM | POA: Diagnosis not present

## 2024-03-26 DIAGNOSIS — Z5111 Encounter for antineoplastic chemotherapy: Secondary | ICD-10-CM | POA: Diagnosis not present

## 2024-03-26 DIAGNOSIS — R609 Edema, unspecified: Secondary | ICD-10-CM | POA: Diagnosis not present

## 2024-03-28 DIAGNOSIS — R609 Edema, unspecified: Secondary | ICD-10-CM | POA: Diagnosis not present

## 2024-03-28 DIAGNOSIS — Z5111 Encounter for antineoplastic chemotherapy: Secondary | ICD-10-CM | POA: Diagnosis not present

## 2024-03-28 DIAGNOSIS — C6211 Malignant neoplasm of descended right testis: Secondary | ICD-10-CM | POA: Diagnosis not present

## 2024-03-29 DIAGNOSIS — C6211 Malignant neoplasm of descended right testis: Secondary | ICD-10-CM | POA: Diagnosis not present

## 2024-03-29 DIAGNOSIS — C6290 Malignant neoplasm of unspecified testis, unspecified whether descended or undescended: Secondary | ICD-10-CM | POA: Diagnosis not present

## 2024-03-29 DIAGNOSIS — R55 Syncope and collapse: Secondary | ICD-10-CM | POA: Diagnosis not present

## 2024-03-29 DIAGNOSIS — D709 Neutropenia, unspecified: Secondary | ICD-10-CM | POA: Diagnosis not present

## 2024-03-29 DIAGNOSIS — R609 Edema, unspecified: Secondary | ICD-10-CM | POA: Diagnosis not present

## 2024-03-29 DIAGNOSIS — Z5111 Encounter for antineoplastic chemotherapy: Secondary | ICD-10-CM | POA: Diagnosis not present

## 2024-03-30 DIAGNOSIS — Z5111 Encounter for antineoplastic chemotherapy: Secondary | ICD-10-CM | POA: Diagnosis not present

## 2024-03-30 DIAGNOSIS — R609 Edema, unspecified: Secondary | ICD-10-CM | POA: Diagnosis not present

## 2024-03-30 DIAGNOSIS — C6211 Malignant neoplasm of descended right testis: Secondary | ICD-10-CM | POA: Diagnosis not present

## 2024-03-31 DIAGNOSIS — Z5111 Encounter for antineoplastic chemotherapy: Secondary | ICD-10-CM | POA: Diagnosis not present

## 2024-03-31 DIAGNOSIS — R609 Edema, unspecified: Secondary | ICD-10-CM | POA: Diagnosis not present

## 2024-03-31 DIAGNOSIS — C6211 Malignant neoplasm of descended right testis: Secondary | ICD-10-CM | POA: Diagnosis not present

## 2024-04-01 DIAGNOSIS — C6211 Malignant neoplasm of descended right testis: Secondary | ICD-10-CM | POA: Diagnosis not present

## 2024-04-01 DIAGNOSIS — Z5111 Encounter for antineoplastic chemotherapy: Secondary | ICD-10-CM | POA: Diagnosis not present

## 2024-04-02 DIAGNOSIS — C6211 Malignant neoplasm of descended right testis: Secondary | ICD-10-CM | POA: Diagnosis not present

## 2024-04-02 DIAGNOSIS — Z5111 Encounter for antineoplastic chemotherapy: Secondary | ICD-10-CM | POA: Diagnosis not present

## 2024-04-04 DIAGNOSIS — C6211 Malignant neoplasm of descended right testis: Secondary | ICD-10-CM | POA: Diagnosis not present

## 2024-04-04 DIAGNOSIS — Z5111 Encounter for antineoplastic chemotherapy: Secondary | ICD-10-CM | POA: Diagnosis not present

## 2024-04-06 DIAGNOSIS — Z5111 Encounter for antineoplastic chemotherapy: Secondary | ICD-10-CM | POA: Diagnosis not present

## 2024-04-06 DIAGNOSIS — C6211 Malignant neoplasm of descended right testis: Secondary | ICD-10-CM | POA: Diagnosis not present

## 2024-04-08 DIAGNOSIS — Z5111 Encounter for antineoplastic chemotherapy: Secondary | ICD-10-CM | POA: Diagnosis not present

## 2024-04-08 DIAGNOSIS — C6211 Malignant neoplasm of descended right testis: Secondary | ICD-10-CM | POA: Diagnosis not present

## 2024-04-10 DIAGNOSIS — Z5111 Encounter for antineoplastic chemotherapy: Secondary | ICD-10-CM | POA: Diagnosis not present

## 2024-04-10 DIAGNOSIS — C6211 Malignant neoplasm of descended right testis: Secondary | ICD-10-CM | POA: Diagnosis not present

## 2024-04-13 DIAGNOSIS — C6211 Malignant neoplasm of descended right testis: Secondary | ICD-10-CM | POA: Diagnosis not present

## 2024-04-13 DIAGNOSIS — Z5111 Encounter for antineoplastic chemotherapy: Secondary | ICD-10-CM | POA: Diagnosis not present

## 2024-04-15 DIAGNOSIS — C6211 Malignant neoplasm of descended right testis: Secondary | ICD-10-CM | POA: Diagnosis not present

## 2024-04-15 DIAGNOSIS — Z5111 Encounter for antineoplastic chemotherapy: Secondary | ICD-10-CM | POA: Diagnosis not present

## 2024-04-17 DIAGNOSIS — Z5111 Encounter for antineoplastic chemotherapy: Secondary | ICD-10-CM | POA: Diagnosis not present

## 2024-04-17 DIAGNOSIS — C6211 Malignant neoplasm of descended right testis: Secondary | ICD-10-CM | POA: Diagnosis not present

## 2024-04-19 DIAGNOSIS — Z5111 Encounter for antineoplastic chemotherapy: Secondary | ICD-10-CM | POA: Diagnosis not present

## 2024-04-19 DIAGNOSIS — C6211 Malignant neoplasm of descended right testis: Secondary | ICD-10-CM | POA: Diagnosis not present

## 2024-04-20 DIAGNOSIS — Z5111 Encounter for antineoplastic chemotherapy: Secondary | ICD-10-CM | POA: Diagnosis not present

## 2024-04-20 DIAGNOSIS — C6211 Malignant neoplasm of descended right testis: Secondary | ICD-10-CM | POA: Diagnosis not present

## 2024-04-21 DIAGNOSIS — C6211 Malignant neoplasm of descended right testis: Secondary | ICD-10-CM | POA: Diagnosis not present

## 2024-04-21 DIAGNOSIS — Z5111 Encounter for antineoplastic chemotherapy: Secondary | ICD-10-CM | POA: Diagnosis not present

## 2024-04-22 DIAGNOSIS — C6211 Malignant neoplasm of descended right testis: Secondary | ICD-10-CM | POA: Diagnosis not present

## 2024-04-22 DIAGNOSIS — Z5111 Encounter for antineoplastic chemotherapy: Secondary | ICD-10-CM | POA: Diagnosis not present

## 2024-04-23 DIAGNOSIS — Z5111 Encounter for antineoplastic chemotherapy: Secondary | ICD-10-CM | POA: Diagnosis not present

## 2024-04-23 DIAGNOSIS — C6211 Malignant neoplasm of descended right testis: Secondary | ICD-10-CM | POA: Diagnosis not present

## 2024-04-25 DIAGNOSIS — C6211 Malignant neoplasm of descended right testis: Secondary | ICD-10-CM | POA: Diagnosis not present

## 2024-04-25 DIAGNOSIS — Z5111 Encounter for antineoplastic chemotherapy: Secondary | ICD-10-CM | POA: Diagnosis not present

## 2024-04-27 DIAGNOSIS — Z5111 Encounter for antineoplastic chemotherapy: Secondary | ICD-10-CM | POA: Diagnosis not present

## 2024-04-27 DIAGNOSIS — C6211 Malignant neoplasm of descended right testis: Secondary | ICD-10-CM | POA: Diagnosis not present

## 2024-04-30 DIAGNOSIS — Z5111 Encounter for antineoplastic chemotherapy: Secondary | ICD-10-CM | POA: Diagnosis not present

## 2024-04-30 DIAGNOSIS — C6211 Malignant neoplasm of descended right testis: Secondary | ICD-10-CM | POA: Diagnosis not present

## 2024-05-02 DIAGNOSIS — C6211 Malignant neoplasm of descended right testis: Secondary | ICD-10-CM | POA: Diagnosis not present

## 2024-05-02 DIAGNOSIS — Z5111 Encounter for antineoplastic chemotherapy: Secondary | ICD-10-CM | POA: Diagnosis not present

## 2024-05-07 DIAGNOSIS — C6211 Malignant neoplasm of descended right testis: Secondary | ICD-10-CM | POA: Diagnosis not present

## 2024-05-07 DIAGNOSIS — Z5111 Encounter for antineoplastic chemotherapy: Secondary | ICD-10-CM | POA: Diagnosis not present

## 2024-05-10 DIAGNOSIS — C6211 Malignant neoplasm of descended right testis: Secondary | ICD-10-CM | POA: Diagnosis not present

## 2024-05-10 DIAGNOSIS — Z5111 Encounter for antineoplastic chemotherapy: Secondary | ICD-10-CM | POA: Diagnosis not present

## 2024-05-11 DIAGNOSIS — Z5111 Encounter for antineoplastic chemotherapy: Secondary | ICD-10-CM | POA: Diagnosis not present

## 2024-05-11 DIAGNOSIS — C6211 Malignant neoplasm of descended right testis: Secondary | ICD-10-CM | POA: Diagnosis not present

## 2024-05-12 DIAGNOSIS — C6211 Malignant neoplasm of descended right testis: Secondary | ICD-10-CM | POA: Diagnosis not present

## 2024-05-12 DIAGNOSIS — Z5111 Encounter for antineoplastic chemotherapy: Secondary | ICD-10-CM | POA: Diagnosis not present

## 2024-05-13 DIAGNOSIS — Z5111 Encounter for antineoplastic chemotherapy: Secondary | ICD-10-CM | POA: Diagnosis not present

## 2024-05-13 DIAGNOSIS — C6211 Malignant neoplasm of descended right testis: Secondary | ICD-10-CM | POA: Diagnosis not present

## 2024-05-14 DIAGNOSIS — Z5111 Encounter for antineoplastic chemotherapy: Secondary | ICD-10-CM | POA: Diagnosis not present

## 2024-05-14 DIAGNOSIS — C6211 Malignant neoplasm of descended right testis: Secondary | ICD-10-CM | POA: Diagnosis not present

## 2024-05-16 DIAGNOSIS — Z5111 Encounter for antineoplastic chemotherapy: Secondary | ICD-10-CM | POA: Diagnosis not present

## 2024-05-16 DIAGNOSIS — C6211 Malignant neoplasm of descended right testis: Secondary | ICD-10-CM | POA: Diagnosis not present

## 2024-05-17 DIAGNOSIS — D72829 Elevated white blood cell count, unspecified: Secondary | ICD-10-CM | POA: Diagnosis not present

## 2024-05-17 DIAGNOSIS — T753XXA Motion sickness, initial encounter: Secondary | ICD-10-CM | POA: Diagnosis not present

## 2024-05-17 DIAGNOSIS — R112 Nausea with vomiting, unspecified: Secondary | ICD-10-CM | POA: Diagnosis not present

## 2024-05-17 DIAGNOSIS — D649 Anemia, unspecified: Secondary | ICD-10-CM | POA: Diagnosis not present

## 2024-05-17 DIAGNOSIS — Z8547 Personal history of malignant neoplasm of testis: Secondary | ICD-10-CM | POA: Diagnosis not present

## 2024-05-17 DIAGNOSIS — K219 Gastro-esophageal reflux disease without esophagitis: Secondary | ICD-10-CM | POA: Diagnosis not present

## 2024-05-17 DIAGNOSIS — E861 Hypovolemia: Secondary | ICD-10-CM | POA: Diagnosis not present

## 2024-05-17 DIAGNOSIS — E869 Volume depletion, unspecified: Secondary | ICD-10-CM | POA: Diagnosis not present

## 2024-05-17 DIAGNOSIS — Y939 Activity, unspecified: Secondary | ICD-10-CM | POA: Diagnosis not present

## 2024-05-17 DIAGNOSIS — T451X5A Adverse effect of antineoplastic and immunosuppressive drugs, initial encounter: Secondary | ICD-10-CM | POA: Diagnosis not present

## 2024-05-17 DIAGNOSIS — Z6822 Body mass index (BMI) 22.0-22.9, adult: Secondary | ICD-10-CM | POA: Diagnosis not present

## 2024-05-17 DIAGNOSIS — E43 Unspecified severe protein-calorie malnutrition: Secondary | ICD-10-CM | POA: Diagnosis not present

## 2024-05-17 DIAGNOSIS — I499 Cardiac arrhythmia, unspecified: Secondary | ICD-10-CM | POA: Diagnosis not present

## 2024-05-17 DIAGNOSIS — K5900 Constipation, unspecified: Secondary | ICD-10-CM | POA: Diagnosis not present

## 2024-05-17 DIAGNOSIS — E871 Hypo-osmolality and hyponatremia: Secondary | ICD-10-CM | POA: Diagnosis not present

## 2024-05-17 DIAGNOSIS — Z9079 Acquired absence of other genital organ(s): Secondary | ICD-10-CM | POA: Diagnosis not present

## 2024-05-17 DIAGNOSIS — R160 Hepatomegaly, not elsewhere classified: Secondary | ICD-10-CM | POA: Diagnosis not present

## 2024-05-17 DIAGNOSIS — C772 Secondary and unspecified malignant neoplasm of intra-abdominal lymph nodes: Secondary | ICD-10-CM | POA: Diagnosis not present

## 2024-05-17 DIAGNOSIS — R638 Other symptoms and signs concerning food and fluid intake: Secondary | ICD-10-CM | POA: Diagnosis not present

## 2024-05-21 DIAGNOSIS — Z5111 Encounter for antineoplastic chemotherapy: Secondary | ICD-10-CM | POA: Diagnosis not present

## 2024-05-21 DIAGNOSIS — C6211 Malignant neoplasm of descended right testis: Secondary | ICD-10-CM | POA: Diagnosis not present

## 2024-05-23 DIAGNOSIS — C6211 Malignant neoplasm of descended right testis: Secondary | ICD-10-CM | POA: Diagnosis not present

## 2024-05-23 DIAGNOSIS — Z5111 Encounter for antineoplastic chemotherapy: Secondary | ICD-10-CM | POA: Diagnosis not present

## 2024-05-25 DIAGNOSIS — C6211 Malignant neoplasm of descended right testis: Secondary | ICD-10-CM | POA: Diagnosis not present

## 2024-05-25 DIAGNOSIS — Z5111 Encounter for antineoplastic chemotherapy: Secondary | ICD-10-CM | POA: Diagnosis not present

## 2024-05-27 DIAGNOSIS — Z5111 Encounter for antineoplastic chemotherapy: Secondary | ICD-10-CM | POA: Diagnosis not present

## 2024-05-27 DIAGNOSIS — C6211 Malignant neoplasm of descended right testis: Secondary | ICD-10-CM | POA: Diagnosis not present

## 2024-05-29 DIAGNOSIS — C6211 Malignant neoplasm of descended right testis: Secondary | ICD-10-CM | POA: Diagnosis not present

## 2024-05-29 DIAGNOSIS — Z5111 Encounter for antineoplastic chemotherapy: Secondary | ICD-10-CM | POA: Diagnosis not present

## 2024-05-31 DIAGNOSIS — I499 Cardiac arrhythmia, unspecified: Secondary | ICD-10-CM | POA: Diagnosis not present

## 2024-05-31 DIAGNOSIS — Z5111 Encounter for antineoplastic chemotherapy: Secondary | ICD-10-CM | POA: Diagnosis not present

## 2024-05-31 DIAGNOSIS — I4892 Unspecified atrial flutter: Secondary | ICD-10-CM | POA: Diagnosis not present

## 2024-05-31 DIAGNOSIS — C6211 Malignant neoplasm of descended right testis: Secondary | ICD-10-CM | POA: Diagnosis not present

## 2024-06-03 DIAGNOSIS — C6211 Malignant neoplasm of descended right testis: Secondary | ICD-10-CM | POA: Diagnosis not present

## 2024-06-07 DIAGNOSIS — C6211 Malignant neoplasm of descended right testis: Secondary | ICD-10-CM | POA: Diagnosis not present

## 2024-06-09 DIAGNOSIS — C6211 Malignant neoplasm of descended right testis: Secondary | ICD-10-CM | POA: Diagnosis not present

## 2024-06-14 DIAGNOSIS — C6211 Malignant neoplasm of descended right testis: Secondary | ICD-10-CM | POA: Diagnosis not present

## 2024-06-16 DIAGNOSIS — C6211 Malignant neoplasm of descended right testis: Secondary | ICD-10-CM | POA: Diagnosis not present

## 2024-06-21 DIAGNOSIS — C6211 Malignant neoplasm of descended right testis: Secondary | ICD-10-CM | POA: Diagnosis not present

## 2024-06-24 DIAGNOSIS — C6211 Malignant neoplasm of descended right testis: Secondary | ICD-10-CM | POA: Diagnosis not present

## 2024-06-28 DIAGNOSIS — C6211 Malignant neoplasm of descended right testis: Secondary | ICD-10-CM | POA: Diagnosis not present

## 2024-07-11 DIAGNOSIS — C6211 Malignant neoplasm of descended right testis: Secondary | ICD-10-CM | POA: Diagnosis not present

## 2024-07-12 DIAGNOSIS — C6211 Malignant neoplasm of descended right testis: Secondary | ICD-10-CM | POA: Diagnosis not present

## 2024-07-12 DIAGNOSIS — J9589 Other postprocedural complications and disorders of respiratory system, not elsewhere classified: Secondary | ICD-10-CM | POA: Diagnosis not present

## 2024-07-15 DIAGNOSIS — C6211 Malignant neoplasm of descended right testis: Secondary | ICD-10-CM | POA: Diagnosis not present

## 2024-07-19 DIAGNOSIS — C6211 Malignant neoplasm of descended right testis: Secondary | ICD-10-CM | POA: Diagnosis not present

## 2024-07-21 DIAGNOSIS — C6211 Malignant neoplasm of descended right testis: Secondary | ICD-10-CM | POA: Diagnosis not present

## 2024-07-22 DIAGNOSIS — C6211 Malignant neoplasm of descended right testis: Secondary | ICD-10-CM | POA: Diagnosis not present

## 2024-07-25 DIAGNOSIS — C6211 Malignant neoplasm of descended right testis: Secondary | ICD-10-CM | POA: Diagnosis not present

## 2024-08-06 DIAGNOSIS — C6211 Malignant neoplasm of descended right testis: Secondary | ICD-10-CM | POA: Diagnosis not present

## 2024-08-12 DIAGNOSIS — C6211 Malignant neoplasm of descended right testis: Secondary | ICD-10-CM | POA: Diagnosis not present

## 2024-08-16 DIAGNOSIS — C6211 Malignant neoplasm of descended right testis: Secondary | ICD-10-CM | POA: Diagnosis not present

## 2024-08-19 DIAGNOSIS — C6211 Malignant neoplasm of descended right testis: Secondary | ICD-10-CM | POA: Diagnosis not present

## 2024-08-25 DIAGNOSIS — C6211 Malignant neoplasm of descended right testis: Secondary | ICD-10-CM | POA: Diagnosis not present

## 2024-08-30 DIAGNOSIS — C6211 Malignant neoplasm of descended right testis: Secondary | ICD-10-CM | POA: Diagnosis not present
